# Patient Record
Sex: Female | Born: 1965 | Race: White | Hispanic: No
Health system: Southern US, Community
[De-identification: ages and names within clinical notes are randomized; demographics above are authoritative.]

---

## 2007-05-26 ENCOUNTER — Ambulatory Visit: Payer: Self-pay | Admitting: Internal Medicine

## 2011-11-12 ENCOUNTER — Ambulatory Visit: Payer: Self-pay

## 2012-03-04 ENCOUNTER — Ambulatory Visit: Payer: Self-pay

## 2014-09-10 ENCOUNTER — Ambulatory Visit: Payer: Self-pay | Admitting: Physician Assistant

## 2014-09-10 LAB — URINALYSIS, COMPLETE
BILIRUBIN, UR: NEGATIVE
Glucose,UR: NEGATIVE
Ketone: 15
Leukocyte Esterase: NEGATIVE
Nitrite: NEGATIVE
PROTEIN: NEGATIVE
Ph: 5.5 (ref 5.0–8.0)
SPECIFIC GRAVITY: 1.02 (ref 1.000–1.030)

## 2014-09-10 LAB — CBC WITH DIFFERENTIAL/PLATELET
Basophil #: 0.1 10*3/uL (ref 0.0–0.1)
Basophil %: 1.3 %
EOS ABS: 0.2 10*3/uL (ref 0.0–0.7)
Eosinophil %: 1.3 %
HCT: 42.4 % (ref 35.0–47.0)
HGB: 14.1 g/dL (ref 12.0–16.0)
LYMPHS ABS: 2 10*3/uL (ref 1.0–3.6)
Lymphocyte %: 17.2 %
MCH: 29.6 pg (ref 26.0–34.0)
MCHC: 33.4 g/dL (ref 32.0–36.0)
MCV: 89 fL (ref 80–100)
Monocyte #: 0.8 x10 3/mm (ref 0.2–0.9)
Monocyte %: 6.9 %
NEUTROS ABS: 8.5 10*3/uL — AB (ref 1.4–6.5)
Neutrophil %: 73.3 %
Platelet: 265 10*3/uL (ref 150–440)
RBC: 4.78 10*6/uL (ref 3.80–5.20)
RDW: 12.8 % (ref 11.5–14.5)
WBC: 11.6 10*3/uL — ABNORMAL HIGH (ref 3.6–11.0)

## 2014-09-10 LAB — COMPREHENSIVE METABOLIC PANEL
ALBUMIN: 4 g/dL (ref 3.4–5.0)
ALK PHOS: 119 U/L — AB
ALT: 36 U/L
ANION GAP: 12 (ref 7–16)
BUN: 6 mg/dL — ABNORMAL LOW (ref 7–18)
Bilirubin,Total: 0.8 mg/dL (ref 0.2–1.0)
CHLORIDE: 101 mmol/L (ref 98–107)
Calcium, Total: 9.7 mg/dL (ref 8.5–10.1)
Co2: 27 mmol/L (ref 21–32)
Creatinine: 1.17 mg/dL (ref 0.60–1.30)
GFR CALC NON AF AMER: 52 — AB
Glucose: 115 mg/dL — ABNORMAL HIGH (ref 65–99)
Osmolality: 278 (ref 275–301)
Potassium: 3.7 mmol/L (ref 3.5–5.1)
SGOT(AST): 19 U/L (ref 15–37)
Sodium: 140 mmol/L (ref 136–145)
TOTAL PROTEIN: 7.9 g/dL (ref 6.4–8.2)

## 2014-09-10 LAB — LIPASE, BLOOD: Lipase: 114 U/L (ref 73–393)

## 2014-09-10 LAB — AMYLASE: Amylase: 38 U/L (ref 25–115)

## 2014-09-11 ENCOUNTER — Observation Stay: Payer: Self-pay | Admitting: Surgery

## 2014-09-11 LAB — CBC WITH DIFFERENTIAL/PLATELET
BASOS ABS: 0.1 10*3/uL (ref 0.0–0.1)
Basophil %: 0.5 %
EOS ABS: 0 10*3/uL (ref 0.0–0.7)
Eosinophil %: 0.1 %
HCT: 38.1 % (ref 35.0–47.0)
HGB: 13 g/dL (ref 12.0–16.0)
Lymphocyte #: 1.1 10*3/uL (ref 1.0–3.6)
Lymphocyte %: 9.7 %
MCH: 30.6 pg (ref 26.0–34.0)
MCHC: 34.2 g/dL (ref 32.0–36.0)
MCV: 90 fL (ref 80–100)
Monocyte #: 0.7 x10 3/mm (ref 0.2–0.9)
Monocyte %: 5.8 %
Neutrophil #: 9.5 10*3/uL — ABNORMAL HIGH (ref 1.4–6.5)
Neutrophil %: 83.9 %
PLATELETS: 230 10*3/uL (ref 150–440)
RBC: 4.25 10*6/uL (ref 3.80–5.20)
RDW: 12.9 % (ref 11.5–14.5)
WBC: 11.3 10*3/uL — AB (ref 3.6–11.0)

## 2014-09-11 LAB — PREGNANCY, URINE: Pregnancy Test, Urine: NEGATIVE m[IU]/mL

## 2014-09-12 LAB — CBC WITH DIFFERENTIAL/PLATELET
BASOS PCT: 0.7 %
Basophil #: 0.1 10*3/uL (ref 0.0–0.1)
EOS PCT: 2.1 %
Eosinophil #: 0.2 10*3/uL (ref 0.0–0.7)
HCT: 34.4 % — ABNORMAL LOW (ref 35.0–47.0)
HGB: 11.6 g/dL — ABNORMAL LOW (ref 12.0–16.0)
Lymphocyte #: 2.5 10*3/uL (ref 1.0–3.6)
Lymphocyte %: 30.6 %
MCH: 30.6 pg (ref 26.0–34.0)
MCHC: 33.7 g/dL (ref 32.0–36.0)
MCV: 91 fL (ref 80–100)
MONO ABS: 0.8 x10 3/mm (ref 0.2–0.9)
Monocyte %: 9.9 %
NEUTROS ABS: 4.6 10*3/uL (ref 1.4–6.5)
NEUTROS PCT: 56.7 %
Platelet: 200 10*3/uL (ref 150–440)
RBC: 3.79 10*6/uL — ABNORMAL LOW (ref 3.80–5.20)
RDW: 13.2 % (ref 11.5–14.5)
WBC: 8.2 10*3/uL (ref 3.6–11.0)

## 2014-09-12 LAB — COMPREHENSIVE METABOLIC PANEL
ANION GAP: 6 — AB (ref 7–16)
Albumin: 2.7 g/dL — ABNORMAL LOW (ref 3.4–5.0)
Alkaline Phosphatase: 102 U/L
BUN: 7 mg/dL (ref 7–18)
Bilirubin,Total: 0.9 mg/dL (ref 0.2–1.0)
CO2: 27 mmol/L (ref 21–32)
Calcium, Total: 7.7 mg/dL — ABNORMAL LOW (ref 8.5–10.1)
Chloride: 109 mmol/L — ABNORMAL HIGH (ref 98–107)
Creatinine: 1.03 mg/dL (ref 0.60–1.30)
EGFR (Non-African Amer.): >60
Glucose: 92 mg/dL (ref 65–99)
Osmolality: 281 (ref 275–301)
POTASSIUM: 3.4 mmol/L — AB (ref 3.5–5.1)
SGOT(AST): 26 U/L (ref 15–37)
SGPT (ALT): 33 U/L
Sodium: 142 mmol/L (ref 136–145)
TOTAL PROTEIN: 5.8 g/dL — AB (ref 6.4–8.2)

## 2015-02-15 NOTE — Op Note (Signed)
PATIENT NAME:  Latasha DunksBASS, [name] MR#:  [id number] DATE OF BIRTH:  1966-02-19  DATE OF PROCEDURE:  09/11/2014  PREOPERATIVE DIAGNOSIS: Acute cholecystitis.   POSTOPERATIVE DIAGNOSIS: Acute cholecystitis.   PROCEDURE: Laparoscopic cholecystectomy.   SURGEON: Dionne Miloichard Kinte Trim, MD.    ASSISTANRobb Matar:  Bernstein, PA-S.   INDICATIONS: This is a patient with unrelenting right upper quadrant pain and a workup showing acute cholecystitis with gallstones. Preoperatively we discussed rationale for surgery, the options of observation, risk of bleeding, infection, recurrence of symptoms, failure to resolve her symptoms, open procedure, bile duct damage, bile duct leak, retained common bile duct stone, or bowel injury, any of which could require further surgery and/or ERCP, stent, and papillotomy. This was all reviewed for her in the preop holding area. She understood and agreed to proceed. No family was present.   FINDINGS: Acute cholecystitis, greatly edematous, single large stone impacted in the infundibulum of the gallbladder.   DESCRIPTION OF PROCEDURE: The patient was induced to general anesthesia. She was given IV antibiotics. VTE prophylaxis was in place. She was prepped and draped in a sterile fashion. Marcaine was infiltrated in skin and subcutaneous tissues around the periumbilical area. Incision was made. Veress needle was placed. Pneumoperitoneum was obtained. A 5 mm trocar port was placed. The abdominal cavity was explored and under direct vision a 10 mm epigastric port and 2 lateral 5 mm ports were placed. The camera was placed in the epigastric site to view back at the periumbilical site. There were some adhesions caudad to the port insertion site, not involved with the port insertion site, no sign of bowel injury or bleeding.   Camera was placed in the periumbilical site and the gallbladder was placed on tension. The peritoneum over the infundibulum was incised bluntly after taking down multiple adhesions  which were greatly edematous and acute. The cystic duct-gallbladder junction was well identified. The cystic lymphatics were doubly clipped and divided. This allowed for good visualization of the cystic duct as it entered the infundibulum of the gallbladder, here it was doubly clipped and divided. The cystic artery was doubly clipped and divided and the gallbladder was taken from the gallbladder fossa with electrocautery and passed out through an enlarged epigastric port site with the aid of an Endo Catch bag. The area was checked for hemostasis and found to be adequate. There was no sign of bleeding, bile leak, or bowel injury. Therefore pneumoperitoneum was released. All ports were removed. Fascial edges at the epigastric site were approximated with figure-of-eight 0 Vicryls, 4-0 subcuticular Monocryl was used at all skin edges. Steri-Strips, Mastisol, and sterile dressings were placed.   The patient tolerated the procedure well. There were no complications. She was taken to the recovery room in stable condition to be admitted for continued care.     ____________________________ Adah Salvageichard E. Excell Seltzerooper, MD rec:bu D: 09/11/2014 10:05:02 ET T: 09/11/2014 13:38:19 ET JOB#: [id number]  cc: Adah Salvageichard E. Excell Seltzerooper, MD, <Dictator> Lattie HawICHARD E Naylah Cork MD ELECTRONICALLY SIGNED 09/11/2014 18:29

## 2015-02-15 NOTE — H&P (Signed)
PATIENT NAME:  Latasha Harrington, Latasha Harrington MR#:  [id number] DATE OF BIRTH:  1966/08/01  DATE OF ADMISSION:  09/10/2014  PRIMARY CARE PHYSICIAN: None.   ADMITTING PHYSICIAN: Orrin Brigham, III, MD  CHIEF COMPLAINT: Abdominal pain.   BRIEF HISTORY: Latasha Harrington is a 49 year old woman seen in the Emergency Room with a 3-day history of mid epigastric, right upper quadrant abdominal pain. She sat down to eat breakfast 3 days ago and right after breakfast developed some significant midepigastric, right upper quadrant pain radiating to her back and both upper quadrants. The pain resolved and she did fairly well the rest of the day. The pain returned on Monday morning with some mild nausea, but she did not vomit. Again, the pain seemed to resolve on its own. She tried some Zantac and she is not certain if that medication had any impact on her symptoms. This morning the pain was much worse and she was ready to see her primary care physician with whom she has recently made an appointment. She does not have a regular family doctor that has seen her. However, the pain became so severe she went to the acute care unit who referred her to the Emergency Room for further evaluation. She vomited in the Emergency Room when given with the GI cocktail. She has had no fever or chills. She has had no diarrhea or constipation.   PAST HISTORY: Looking back over the last year, she has had any intermittent symptoms of mild right upper quadrant, midepigastric abdominal pain similar in nature to the current symptoms, although not as severe. She denies any history of hepatitis, yellow jaundice, pancreatitis, peptic ulcer disease, gallbladder disease, or diverticulitis. Only previous surgeries have been 3 C-sections and a previous traumatic bladder injury requiring repair almost 20 years ago. She denies a history of cardiac disease, hypertension, diabetes, or thyroid problems.   CURRENT MEDICATIONS: Birth control pills.   ALLERGIES: BACTRIM WHICH  CAUSES HIVES.  SOCIAL HISTORY: She is not a cigarette smoker. She does not drink any alcohol.   FAMILY HISTORY: She has a strong family history of biliary tract disease and hypertension.   REVIEW OF SYSTEMS: She does not have any other significant symptoms by her review of systems at the present time.   PHYSICAL EXAMINATION:  GENERAL: She is lying quietly in bed texting on her phone. Appears to be comfortable. She denies any pain at the present time.  VITAL SIGNS: Temperature is 98.4. Blood pressure 160/88. Heart rate is 74 and regular.  HEENT: No scleral icterus. No pupillary abnormalities. No facial deformities.  NECK: Supple, nontender. Midline trachea.  CHEST: Clear with no adventitious sounds. She has normal pulmonary excursion.  CARDIAC: No murmurs or gallops to my ear, and she seems to be in normal sinus rhythm.  ABDOMEN: Soft with some mild mid epigastric tenderness. She has no rebound, no guarding, no masses, no hernias noted. She has a midline lower abdominal scar.  EXTREMITIES: Lower extremity exam reveals full range of motion, no deformities. Good distal pulses.  PSYCHIATRIC: Normal orientation, normal affect.   DIAGNOSTIC DATA: Workup in the Emergency Room revealed a slightly elevated white blood cell count of 11,000. Liver function tests were largely unremarkable, although her alkaline phosphatase was slightly elevated at 119. Ultrasound demonstrated what appeared to be impacted cystic duct stone with significant sludge in the gallbladder. There was some question of a positive sonographic Murphy's sign, but no evidence of any pericholecystic fluid or gallbladder wall thickening. The surgical service was consulted.  She appears to have symptomatic biliary tract disease. I doubt that she has acute cholecystitis. I talked with her about the options. We will plan to admit her to the hospital, place her on IV antibiotics, and consider surgical intervention tomorrow. This plan has been  discussed with the patient and they are in agreement.   ____________________________ Carmie Endalph L. Ely III, MD rle:am D: 09/10/2014 23:38:21 ET T: 09/11/2014 02:51:03 ET JOB#: [id number]  cc: Quentin Orealph L. Ely III, MD, <Dictator> Quentin OreALPH L ELY MD ELECTRONICALLY SIGNED 09/11/2014 20:02

## 2015-02-15 NOTE — Discharge Summary (Signed)
PATIENT NAME:  Latasha DunksBASS, Ceciley C MR#:  [id number] DATE OF BIRTH:  1966-02-25  DATE OF ADMISSION:  09/10/2014 DATE OF DISCHARGE:  09/12/2014  DISCHARGE DIAGNOSES: Cholelithiasis and acute cholecystitis.   PROCEDURE: Laparoscopic cholecystectomy.   HISTORY OF PRESENT ILLNESS AND HOSPITAL COURSE: This is a patient who was previously healthy who has had unrelenting right upper quadrant pain and work-up showing gallstones and probable acute cholecystitis.   She was taken to the operating room where laparoscopic cholecystectomy was performed confirming the presence of severe acute cholecystitis. She made an uncomplicated postoperative recovery, was discharged in stable condition, tolerating a regular diet, on oral analgesics, to follow up in our office in 10 days.  ____________________________ Adah Salvageichard E. Excell Seltzerooper, MD rec:sb D: 09/12/2014 11:25:34 ET T: 09/12/2014 16:22:26 ET JOB#: [id number]  cc: Adah Salvageichard E. Excell Seltzerooper, MD, <Dictator> Lattie HawICHARD E Lesly Pontarelli MD ELECTRONICALLY SIGNED 09/13/2014 7:33

## 2015-02-17 LAB — SURGICAL PATHOLOGY

## 2015-06-13 IMAGING — US ABDOMEN ULTRASOUND LIMITED
1 series · 14 of 25 positions shown · non-contrast
Comparison: None.

CLINICAL DATA: Abdominal pain

EXAM:
US ABDOMEN LIMITED - RIGHT UPPER QUADRANT

[Series 1: abdomen ultrasound limited · 0.22mm/px · 14 of 36 slices shown]
[im 1/36]
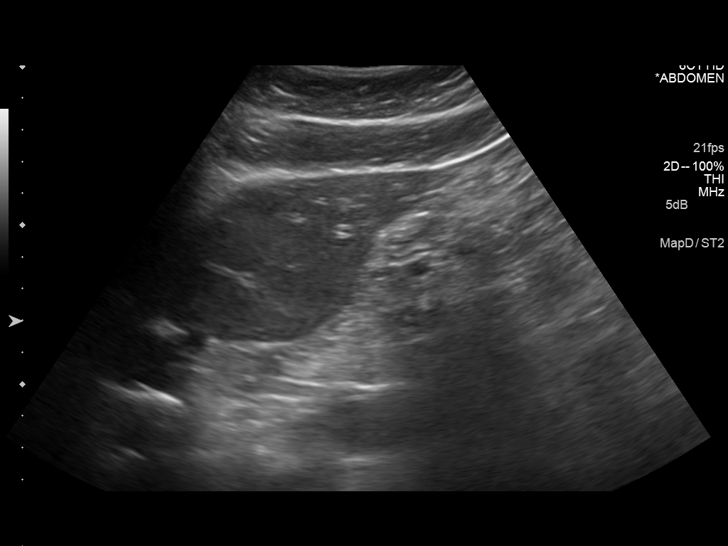
[im 3/36]
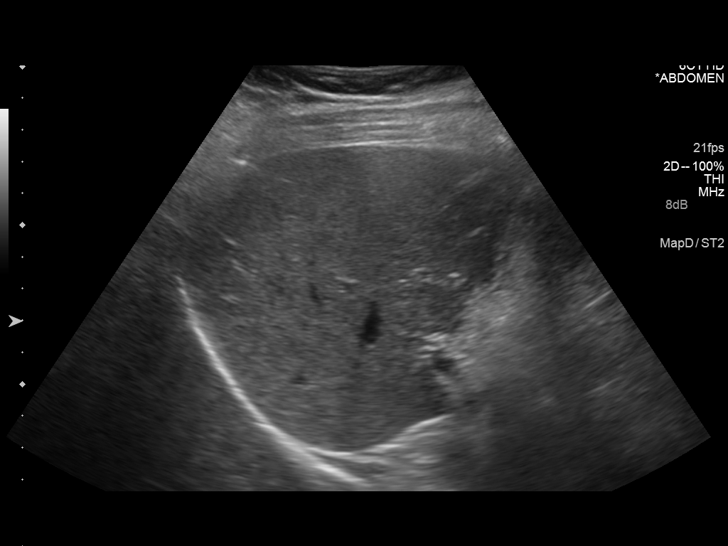
[im 6/36]
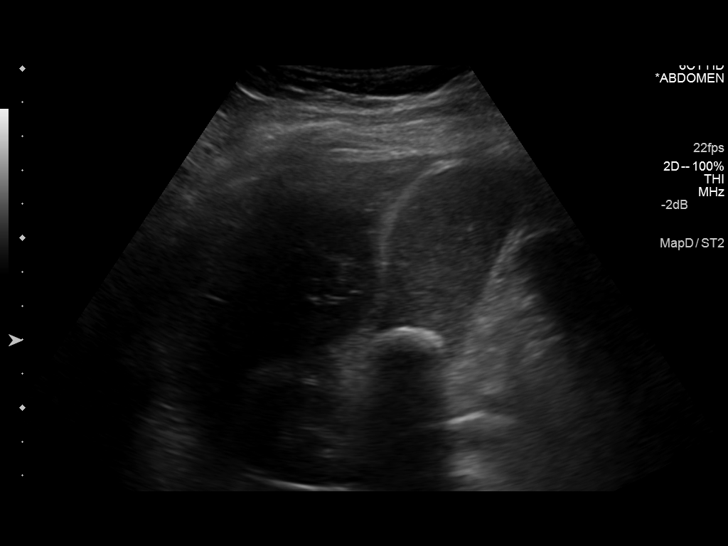
[im 9/36]
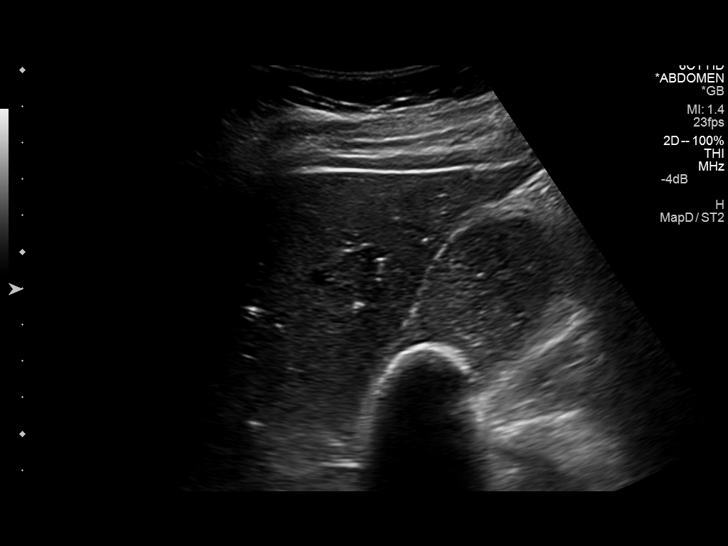
[im 12/36]
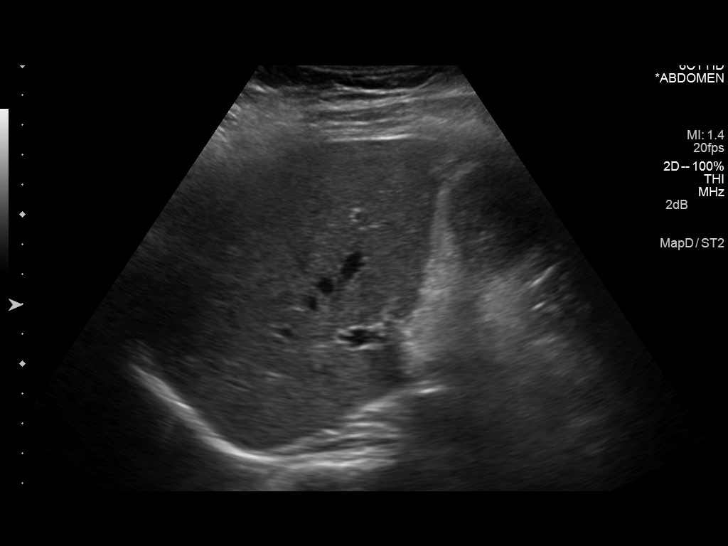
[im 14/36]
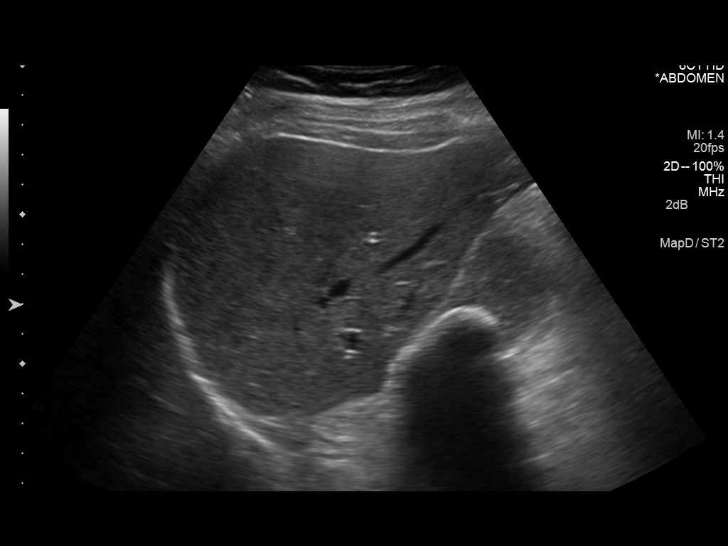
[im 17/36]
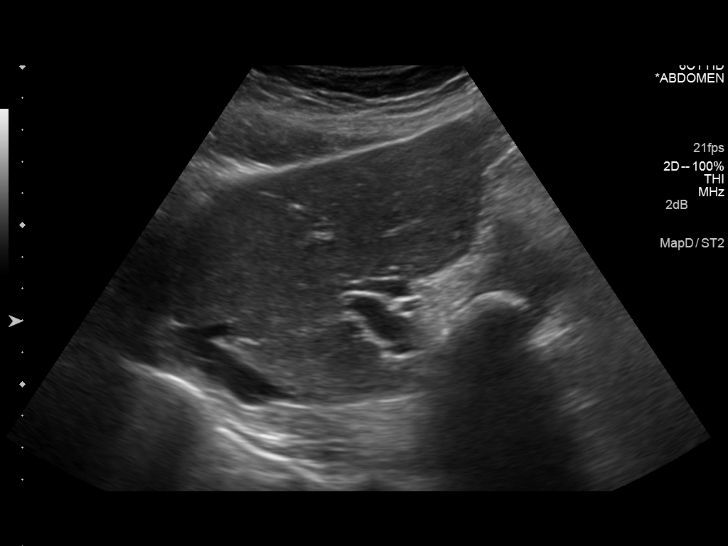
[im 19/36]
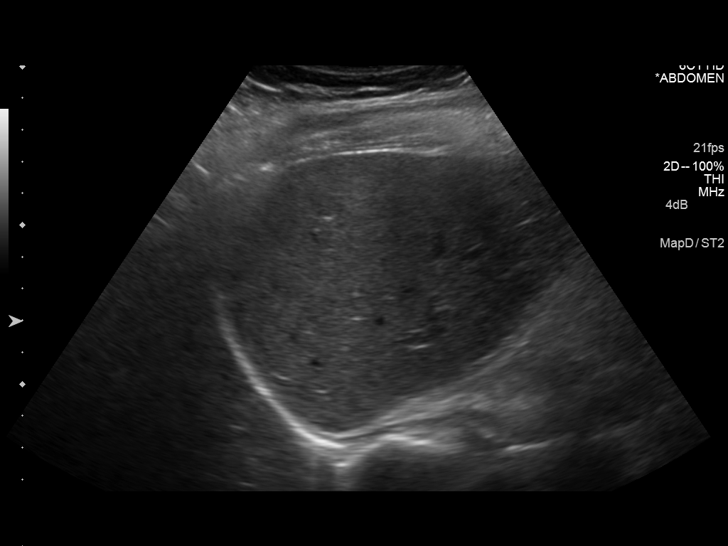
[im 22/36]
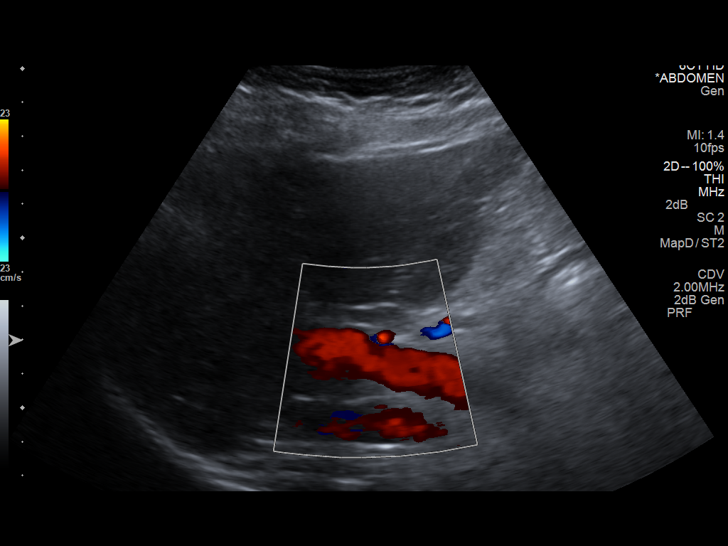
[im 24/36]
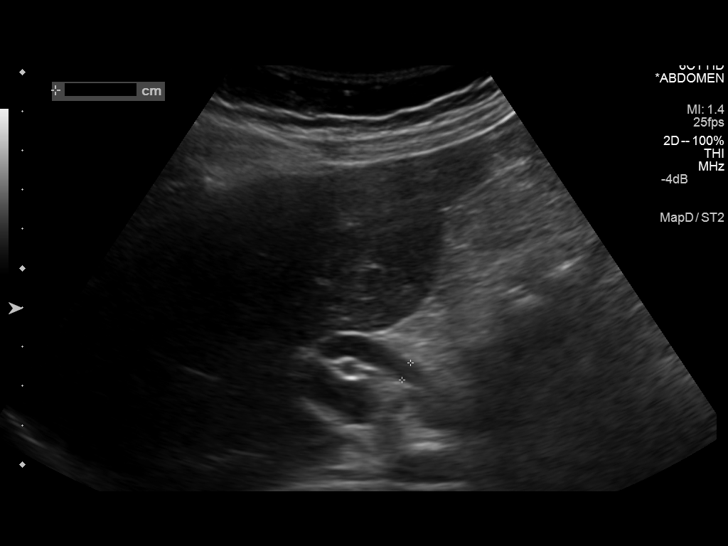
[im 27/36]
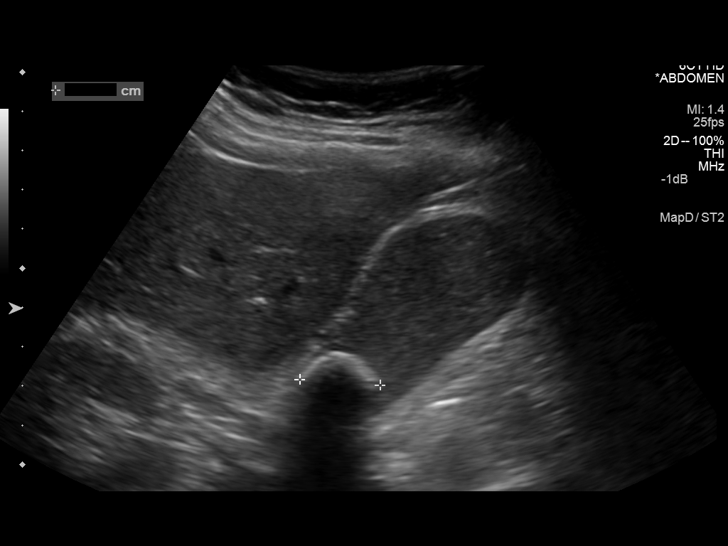
[im 30/36]
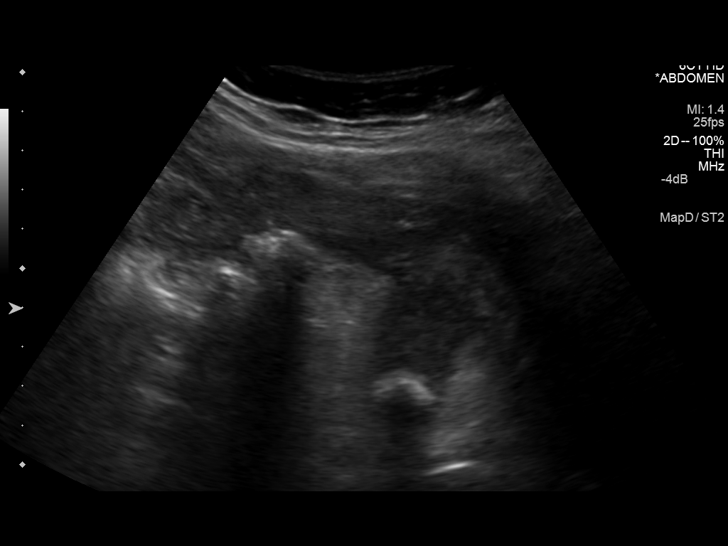
[im 33/36]
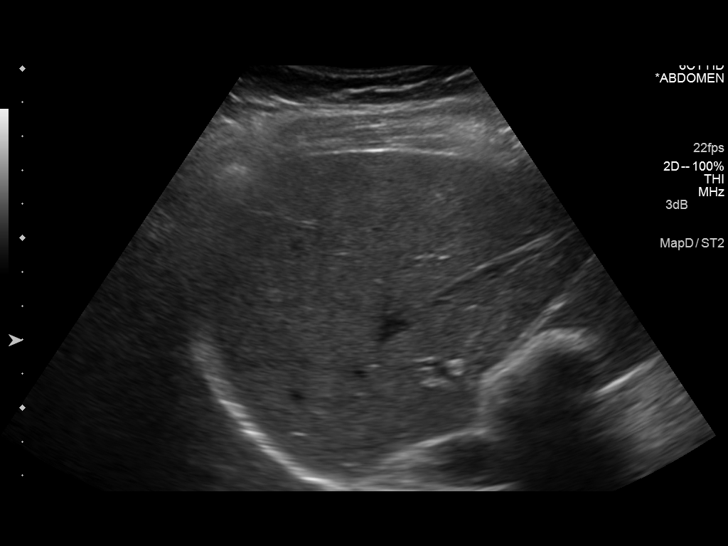
[im 36/36]
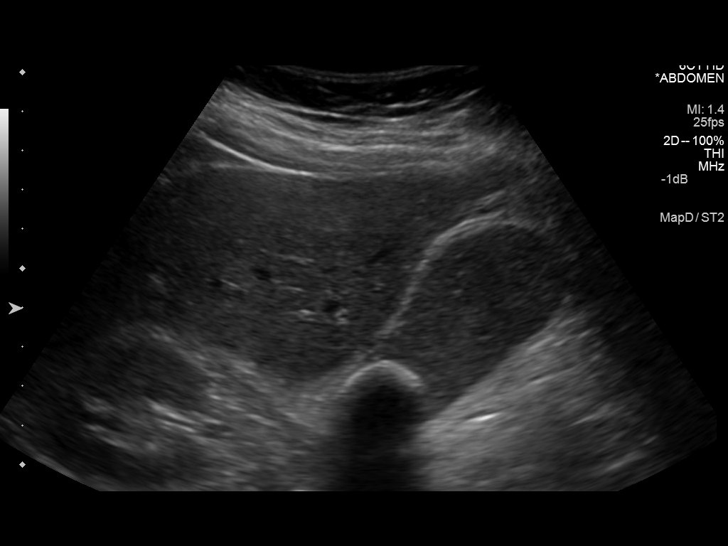

[14 of 25 positions shown; findings below may reference images not displayed]

FINDINGS: Gallbladder:

A large gallstone is noted centrally within the gallbladder.
Gallbladder sludge is also noted. Stone is nonmobile wall near the
neck and may be obstructive. No wall thickening or pericholecystic
fluid is noted. A positive sonographic Murphy sign is noted.

Common bile duct:

Diameter: 5.2 mm.

Liver:

No focal lesion identified. Within normal limits in parenchymal
echogenicity.
IMPRESSION: Large gallstone in the neck of the gallbladder with considerable
gallbladder sludge. This may be occlusive in nature. Clinical
correlation is recommended.

## 2021-11-09 ENCOUNTER — Encounter: Payer: Self-pay | Admitting: Neurology

## 2021-11-09 ENCOUNTER — Ambulatory Visit: Payer: Self-pay | Admitting: Neurology
